# Patient Record
Sex: Male | Born: 1955 | Race: Black or African American | Hispanic: No | Marital: Married
Health system: Southern US, Community
[De-identification: ages and names within clinical notes are randomized; demographics above are authoritative.]

---

## 2020-12-29 ENCOUNTER — Emergency Department (HOSPITAL_COMMUNITY)
Admission: EM | Admit: 2020-12-29 | Discharge: 2020-12-29 | Disposition: A | Discharge status: Home / Self Care | Payer: No Typology Code available for payment source | Attending: Emergency Medicine | Admitting: Emergency Medicine

## 2020-12-29 ENCOUNTER — Other Ambulatory Visit: Payer: Self-pay

## 2020-12-29 ENCOUNTER — Encounter (HOSPITAL_COMMUNITY): Payer: Self-pay

## 2020-12-29 ENCOUNTER — Emergency Department (HOSPITAL_COMMUNITY): Payer: No Typology Code available for payment source

## 2020-12-29 DIAGNOSIS — S161XXA Strain of muscle, fascia and tendon at neck level, initial encounter: Secondary | ICD-10-CM

## 2020-12-29 DIAGNOSIS — Y92410 Unspecified street and highway as the place of occurrence of the external cause: Secondary | ICD-10-CM | POA: Diagnosis not present

## 2020-12-29 DIAGNOSIS — S199XXA Unspecified injury of neck, initial encounter: Secondary | ICD-10-CM | POA: Diagnosis present

## 2020-12-29 MED ORDER — CYCLOBENZAPRINE HCL 10 MG PO TABS
10.0000 mg | ORAL_TABLET | Freq: Once | ORAL | Status: AC
  Administered 2020-12-29: 10 mg via ORAL
  Filled 2020-12-29: qty 1

## 2020-12-29 MED ORDER — CYCLOBENZAPRINE HCL 10 MG PO TABS: 10.0000 mg | ORAL_TABLET | Freq: Two times a day (BID) | ORAL | 0 refills | Status: AC | PRN
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
